# Patient Record
Sex: Female | Born: 1940 | Race: Black or African American | Hispanic: No | Smoking: Never smoker
Health system: Southern US, Community
[De-identification: ages and names within clinical notes are randomized; demographics above are authoritative.]

## PROBLEM LIST (undated history)

## (undated) DIAGNOSIS — I10 Essential (primary) hypertension: Secondary | ICD-10-CM

---

## 2000-01-21 ENCOUNTER — Other Ambulatory Visit: Admission: RE | Admit: 2000-01-21 | Discharge: 2000-01-21 | Payer: Self-pay | Admitting: Internal Medicine

## 2000-02-05 ENCOUNTER — Ambulatory Visit (HOSPITAL_COMMUNITY): Admission: RE | Admit: 2000-02-05 | Discharge: 2000-02-05 | Payer: Self-pay | Admitting: Internal Medicine

## 2007-12-31 ENCOUNTER — Emergency Department (HOSPITAL_COMMUNITY): Admission: EM | Admit: 2007-12-31 | Discharge: 2007-12-31 | Payer: Self-pay | Admitting: Emergency Medicine

## 2008-01-29 ENCOUNTER — Encounter (INDEPENDENT_AMBULATORY_CARE_PROVIDER_SITE_OTHER): Payer: Self-pay | Admitting: Nurse Practitioner

## 2008-01-29 ENCOUNTER — Ambulatory Visit: Payer: Self-pay | Admitting: Internal Medicine

## 2008-01-29 LAB — CONVERTED CEMR LAB
ALT: <8 units/L (ref 0–35)
AST: 10 units/L (ref 0–37)
Albumin: 4.4 g/dL (ref 3.5–5.2)
Alkaline Phosphatase: 91 units/L (ref 39–117)
BUN: 12 mg/dL (ref 6–23)
Basophils Absolute: 0 10*3/uL (ref 0.0–0.1)
Basophils Relative: 1 % (ref 0–1)
CO2: 23 meq/L (ref 19–32)
Calcium: 10 mg/dL (ref 8.4–10.5)
Chloride: 100 meq/L (ref 96–112)
Creatinine, Ser: 0.92 mg/dL (ref 0.40–1.20)
Eosinophils Absolute: 0.2 10*3/uL (ref 0.0–0.7)
Eosinophils Relative: 3 % (ref 0–5)
Glucose, Bld: 104 mg/dL — ABNORMAL HIGH (ref 70–99)
HCT: 43.7 % (ref 36.0–46.0)
Hemoglobin: 13.9 g/dL (ref 12.0–15.0)
Lymphocytes Relative: 44 % (ref 12–46)
Lymphs Abs: 3 10*3/uL (ref 0.7–4.0)
MCHC: 31.8 g/dL (ref 30.0–36.0)
MCV: 90.5 fL (ref 78.0–100.0)
Monocytes Absolute: 0.3 10*3/uL (ref 0.1–1.0)
Monocytes Relative: 4 % (ref 3–12)
Neutro Abs: 3.3 10*3/uL (ref 1.7–7.7)
Neutrophils Relative %: 48 % (ref 43–77)
Platelets: 274 10*3/uL (ref 150–400)
Potassium: 4.3 meq/L (ref 3.5–5.3)
RBC: 4.83 M/uL (ref 3.87–5.11)
RDW: 14.5 % (ref 11.5–15.5)
Sodium: 134 meq/L — ABNORMAL LOW (ref 135–145)
TSH: 1.596 microintl units/mL (ref 0.350–5.50)
Total Bilirubin: 0.4 mg/dL (ref 0.3–1.2)
Total Protein: 7.8 g/dL (ref 6.0–8.3)
WBC: 6.9 10*3/uL (ref 4.0–10.5)

## 2008-04-29 ENCOUNTER — Ambulatory Visit: Payer: Self-pay | Admitting: Internal Medicine

## 2008-04-29 ENCOUNTER — Encounter (INDEPENDENT_AMBULATORY_CARE_PROVIDER_SITE_OTHER): Payer: Self-pay | Admitting: Nurse Practitioner

## 2008-04-29 LAB — CONVERTED CEMR LAB
Cholesterol: 277 mg/dL — ABNORMAL HIGH (ref 0–200)
HDL: 58 mg/dL (ref >39)
LDL Cholesterol: 177 mg/dL — ABNORMAL HIGH (ref 0–99)
Total CHOL/HDL Ratio: 4.8
Triglycerides: 208 mg/dL — ABNORMAL HIGH (ref <150)
VLDL: 42 mg/dL — ABNORMAL HIGH (ref 0–40)

## 2008-05-06 ENCOUNTER — Ambulatory Visit (HOSPITAL_COMMUNITY): Admission: RE | Admit: 2008-05-06 | Discharge: 2008-05-06 | Payer: Self-pay | Admitting: Family Medicine

## 2009-01-28 IMAGING — CT CT HEAD W/O CM
1 series · 16 of 28 positions shown, 20 images · IV contrast (agent unspecified)
Comparison: None.

CLINICAL DATA: 66-year-old female, dizziness. Not feeling well. Facial droop on the right side.   Question Bell?s palsy. 
 HEAD CT WITHOUT CONTRAST:
TECHNIQUE: Contiguous axial images were obtained from the base of the skull through the vertex according to standard protocol without contrast.

[Series 2: brain · axial · 0.47mm/px · z∈[+119,+244]mm · 16 of 28 slices shown, 20 images]
[im 2/28  brain]
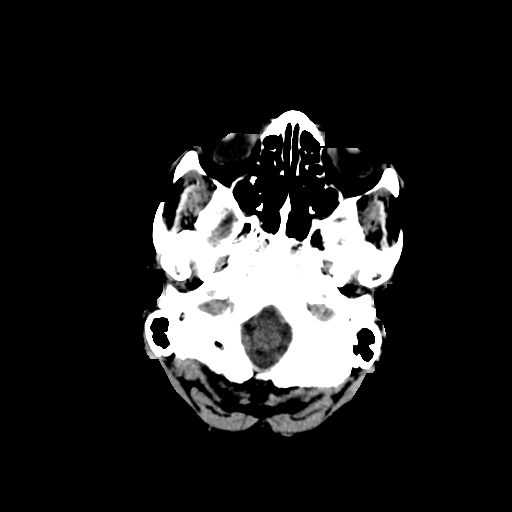
[im 2/28  bone]
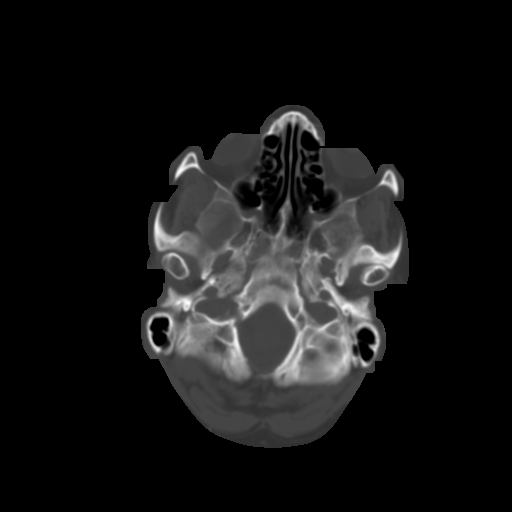
[im 4/28  brain]
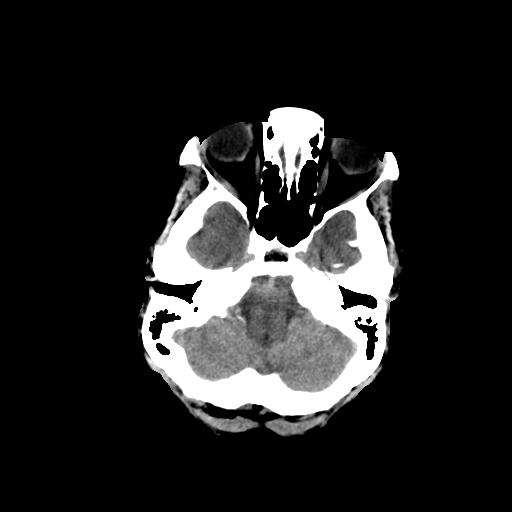
[im 6/28  brain]
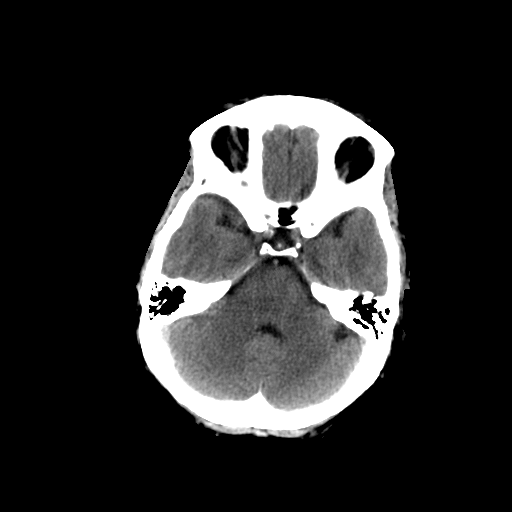
[im 7/28  brain]
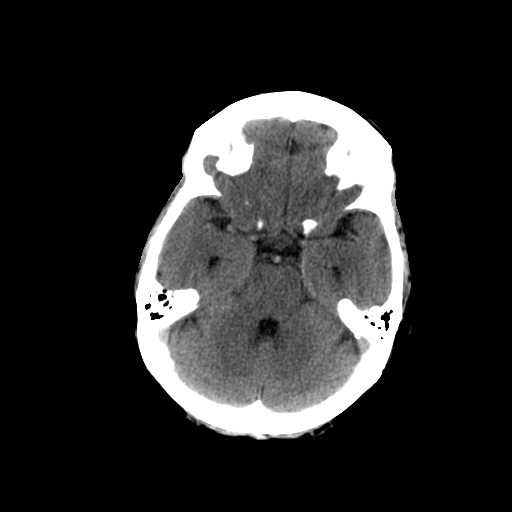
[im 9/28  brain]
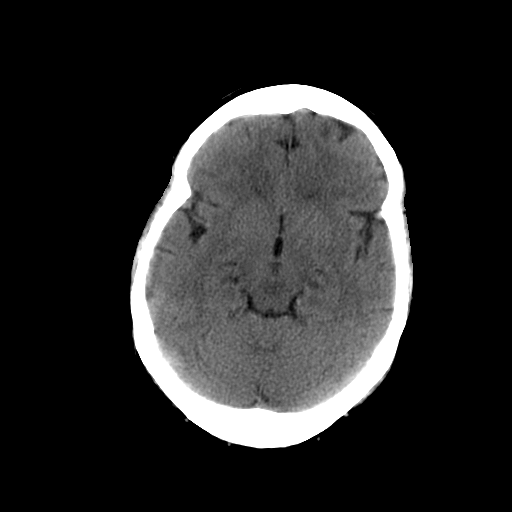
[im 9/28  bone]
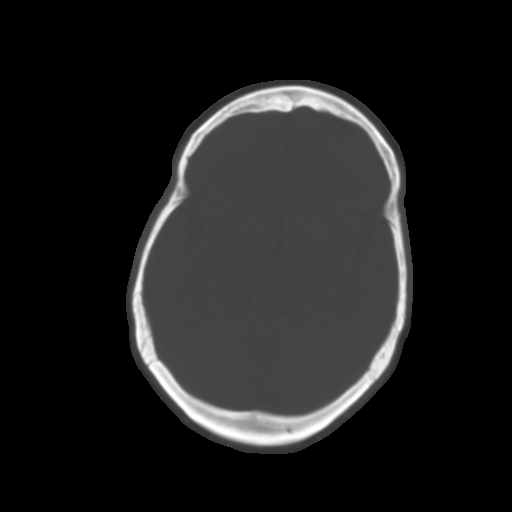
[im 10/28  brain]
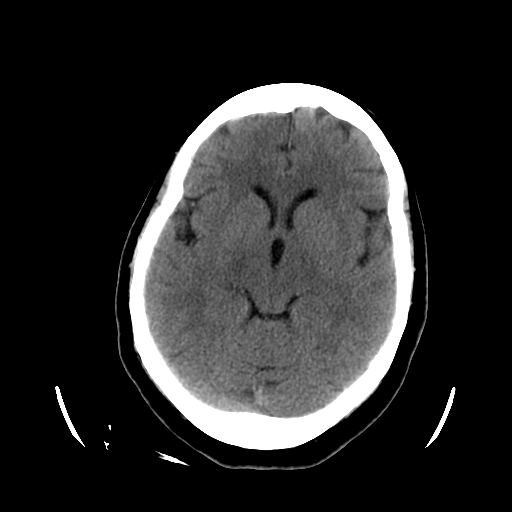
[im 12/28  brain]
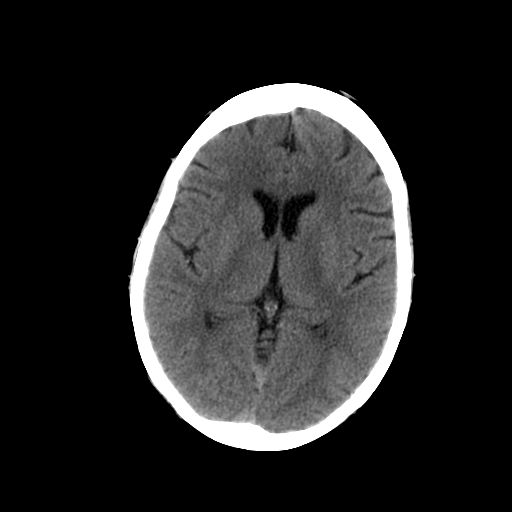
[im 14/28  brain]
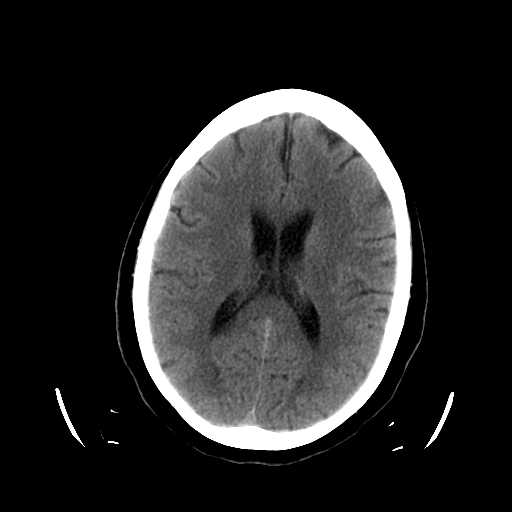
[im 15/28  brain]
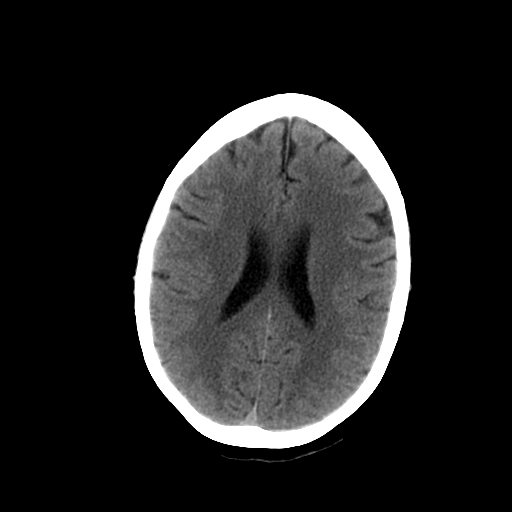
[im 15/28  bone]
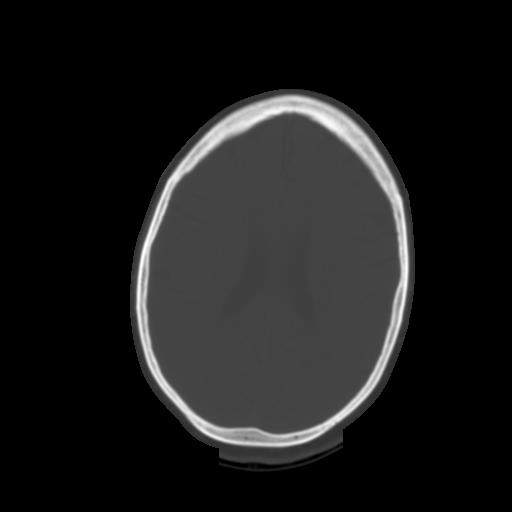
[im 17/28  brain]
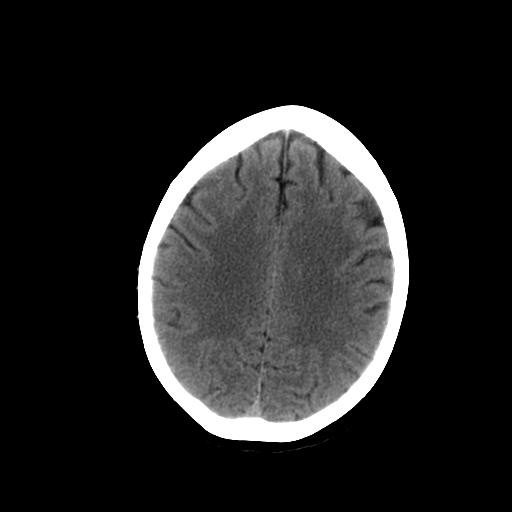
[im 19/28  brain]
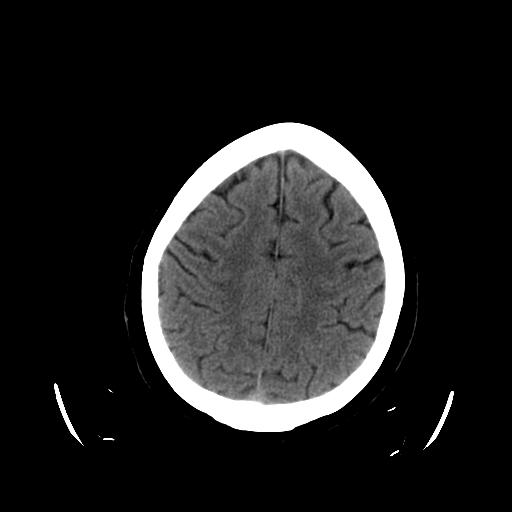
[im 20/28  brain]
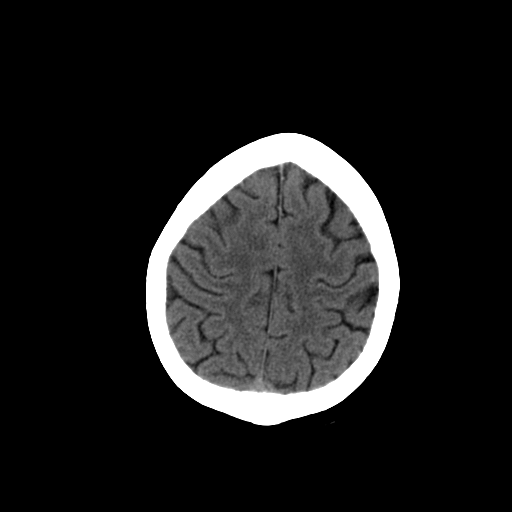
[im 22/28  brain]
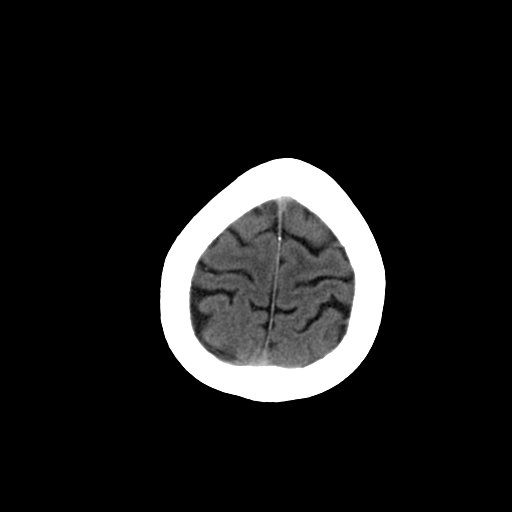
[im 22/28  bone]
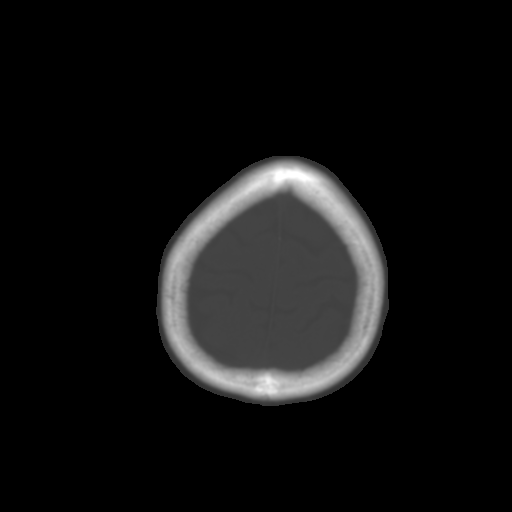
[im 23/28  brain]
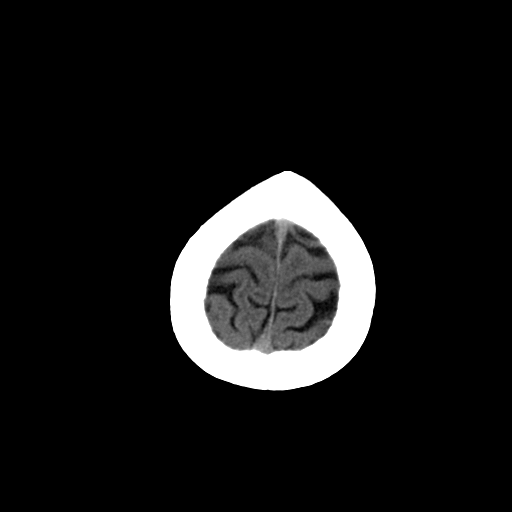
[im 25/28  brain]
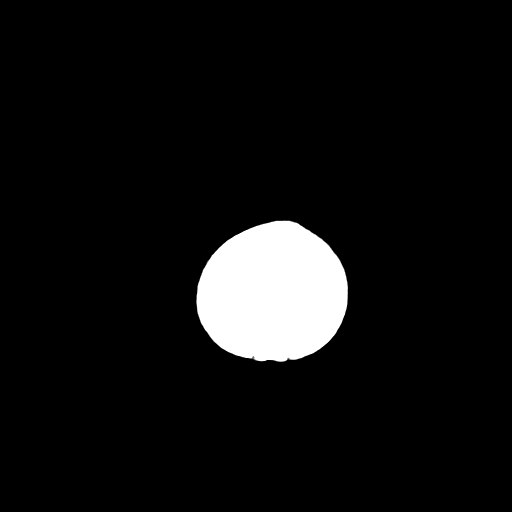
[im 27/28  brain]
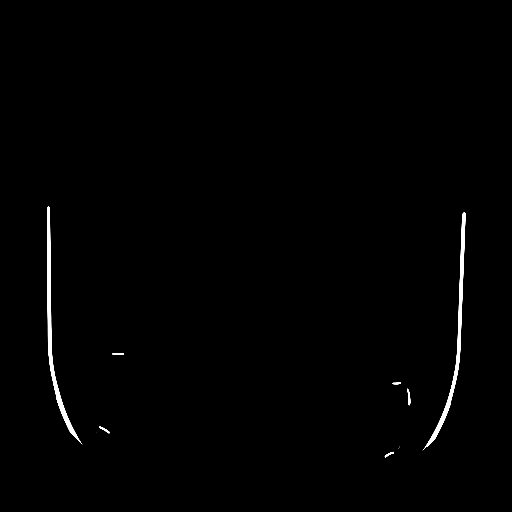

[16 of 28 positions shown; findings below may reference images not displayed]

FINDINGS: No acute intracranial abnormalities are present.  Specifically, there is no evidence for acute infarct, hemorrhage, mass, hydrocephalus, or extraaxial fluid collection.  The extracranial soft tissues are unremarkable.  The globes and orbits are intact.  The paranasal sinuses and mastoid air cells are clear.
IMPRESSION: No acute intracranial abnormality.

## 2011-08-30 LAB — URINALYSIS, ROUTINE W REFLEX MICROSCOPIC
Glucose, UA: NEGATIVE
Ketones, ur: 15 — AB
Nitrite: NEGATIVE
Protein, ur: 100 — AB
Specific Gravity, Urine: 1.036 — ABNORMAL HIGH
Urobilinogen, UA: 1
pH: 5.5

## 2011-08-30 LAB — URINE MICROSCOPIC-ADD ON

## 2014-07-20 ENCOUNTER — Emergency Department (INDEPENDENT_AMBULATORY_CARE_PROVIDER_SITE_OTHER)
Admission: EM | Admit: 2014-07-20 | Discharge: 2014-07-20 | Disposition: A | Discharge status: Home / Self Care | Payer: Medicare Other | Source: Home / Self Care

## 2014-07-20 ENCOUNTER — Encounter (HOSPITAL_COMMUNITY): Payer: Self-pay | Admitting: Emergency Medicine

## 2014-07-20 DIAGNOSIS — L237 Allergic contact dermatitis due to plants, except food: Secondary | ICD-10-CM

## 2014-07-20 DIAGNOSIS — L255 Unspecified contact dermatitis due to plants, except food: Secondary | ICD-10-CM

## 2014-07-20 MED ORDER — TRIAMCINOLONE ACETONIDE 0.1 % EX CREA: 1.0000 "application " | TOPICAL_CREAM | Freq: Two times a day (BID) | CUTANEOUS | Status: DC

## 2014-07-20 MED ORDER — PREDNISONE 20 MG PO TABS: ORAL_TABLET | ORAL | Status: DC

## 2014-07-20 NOTE — ED Notes (Signed)
Pt c/o rash on face, chest, arms onset Monday am when she woke up Reports she was doing yard work on Saturday  Denies fevers, cold sx, SOB, dyspnea Alert w/no signs of acute distress.

## 2014-07-20 NOTE — Discharge Instructions (Signed)
Contact Dermatitis Contact dermatitis is a rash that happens when something touches the skin. You touched something that irritates your skin, or you have allergies to something you touched. HOME CARE   Avoid the thing that caused your rash.  Keep your rash away from hot water, soap, sunlight, chemicals, and other things that might bother it.  Do not scratch your rash.  You can take cool baths to help stop itching.  Only take medicine as told by your doctor.  Keep all doctor visits as told. GET HELP RIGHT AWAY IF:   Your rash is not better after 3 days.  Your rash gets worse.  Your rash is puffy (swollen), tender, red, sore, or warm.  You have problems with your medicine. MAKE SURE YOU:   Understand these instructions.  Will watch your condition.  Will get help right away if you are not doing well or get worse. Document Released: 09/22/2009 Document Revised: 02/17/2012 Document Reviewed: 04/30/2011 [facility] Patient Information 2015 [place], [place]. This information is not intended to replace advice given to you by your health care provider. Make sure you discuss any questions you have with your health care provider.  Contact Dermatitis Contact dermatitis is a rash that happens when something touches the skin. You touched something that irritates your skin, or you have allergies to something you touched. HOME CARE   Avoid the thing that caused your rash.  Keep your rash away from hot water, soap, sunlight, chemicals, and other things that might bother it.  Do not scratch your rash.  You can take cool baths to help stop itching.  Only take medicine as told by your doctor.  Keep all doctor visits as told. GET HELP RIGHT AWAY IF:   Your rash is not better after 3 days.  Your rash gets worse.  Your rash is puffy (swollen), tender, red, sore, or warm.  You have problems with your medicine. MAKE SURE YOU:   Understand these instructions.  Will watch your  condition.  Will get help right away if you are not doing well or get worse. Document Released: 09/22/2009 Document Revised: 02/17/2012 Document Reviewed: 04/30/2011 [facility] Patient Information 2015 [place], [place]. This information is not intended to replace advice given to you by your health care provider. Make sure you discuss any questions you have with your health care provider.  Contact Dermatitis Contact dermatitis is a reaction to certain substances that touch the skin. Contact dermatitis can be either irritant contact dermatitis or allergic contact dermatitis. Irritant contact dermatitis does not require previous exposure to the substance for a reaction to occur.Allergic contact dermatitis only occurs if you have been exposed to the substance before. Upon a repeat exposure, your body reacts to the substance.  CAUSES  Many substances can cause contact dermatitis. Irritant dermatitis is most commonly caused by repeated exposure to mildly irritating substances, such as:  Makeup.  Soaps.  Detergents.  Bleaches.  Acids.  Metal salts, such as nickel. Allergic contact dermatitis is most commonly caused by exposure to:  Poisonous plants.  Chemicals (deodorants, shampoos).  Jewelry.  Latex.  Neomycin in triple antibiotic cream.  Preservatives in products, including clothing. SYMPTOMS  The area of skin that is exposed may develop:  Dryness or flaking.  Redness.  Cracks.  Itching.  Pain or a burning sensation.  Blisters. With allergic contact dermatitis, there may also be swelling in areas such as the eyelids, mouth, or genitals.  DIAGNOSIS  Your caregiver can usually tell what the problem is  by doing a physical exam. In cases where the cause is uncertain and an allergic contact dermatitis is suspected, a patch skin test may be performed to help determine the cause of your dermatitis. TREATMENT Treatment includes protecting the skin from further contact with the  irritating substance by avoiding that substance if possible. Barrier creams, powders, and gloves may be helpful. Your caregiver may also recommend:  Steroid creams or ointments applied 2 times daily. For best results, soak the rash area in cool water for 20 minutes. Then apply the medicine. Cover the area with a plastic wrap. You can store the steroid cream in the refrigerator for a "chilly" effect on your rash. That may decrease itching. Oral steroid medicines may be needed in more severe cases.  Antibiotics or antibacterial ointments if a skin infection is present.  Antihistamine lotion or an antihistamine taken by mouth to ease itching.  Lubricants to keep moisture in your skin.  Burow's solution to reduce redness and soreness or to dry a weeping rash. Mix one packet or tablet of solution in 2 cups cool water. Dip a clean washcloth in the mixture, wring it out a bit, and put it on the affected area. Leave the cloth in place for 30 minutes. Do this as often as possible throughout the day.  Taking several cornstarch or baking soda baths daily if the area is too large to cover with a washcloth. Harsh chemicals, such as alkalis or acids, can cause skin damage that is like a burn. You should flush your skin for 15 to 20 minutes with cold water after such an exposure. You should also seek immediate medical care after exposure. Bandages (dressings), antibiotics, and pain medicine may be needed for severely irritated skin.  HOME CARE INSTRUCTIONS  Avoid the substance that caused your reaction.  Keep the area of skin that is affected away from hot water, soap, sunlight, chemicals, acidic substances, or anything else that would irritate your skin.  Do not scratch the rash. Scratching may cause the rash to become infected.  You may take cool baths to help stop the itching.  Only take over-the-counter or prescription medicines as directed by your caregiver.  See your caregiver for follow-up care as  directed to make sure your skin is healing properly. SEEK MEDICAL CARE IF:   Your condition is not better after 3 days of treatment.  You seem to be getting worse.  You see signs of infection such as swelling, tenderness, redness, soreness, or warmth in the affected area.  You have any problems related to your medicines. Document Released: 11/22/2000 Document Revised: 02/17/2012 Document Reviewed: 04/30/2011 [facility] Patient Information 2015 [place], [place]. This information is not intended to replace advice given to you by your health care provider. Make sure you discuss any questions you have with your health care provider.  Poison [facility] is an inflammation of the skin (contact dermatitis). It is caused by contact with the allergens on the leaves of the oak (toxicodendron) plants. Depending on your sensitivity, the rash may consist simply of redness and itching, or it may also progress to blisters which may break open (rupture). These must be well cared for to prevent secondary germ (bacterial) infection as these infections can lead to scarring. The eyes may also get puffy. The puffiness is worst in the morning and gets better as the day progresses. Healing is best accomplished by keeping any open areas dry, clean, covered with a bandage, and covered with an antibacterial ointment if  needed. Without secondary infection, this dermatitis usually heals without scarring within 2 to 3 weeks without treatment. HOME CARE INSTRUCTIONS When you have been exposed to poison oak, it is very important to thoroughly wash with soap and water as soon as the exposure has been discovered. You have about one half hour to remove the plant resin before it will cause the rash. This cleaning will quickly destroy the oil or antigen on the skin (the antigen is what causes the rash). Wash aggressively under the fingernails as any plant resin still there will continue to spread the rash. Do not rub skin vigorously  when washing affected area. Poison oak cannot spread if no oil from the plant remains on your body. Rash that has progressed to weeping sores (lesions) will not spread the rash unless you have not washed thoroughly. It is also important to clean any clothes you have been wearing as they may carry active allergens which will spread the rash, even several days later. Avoidance of the plant in the future is the best measure. Poison oak plants can be recognized by the number of leaves. Generally, poison oak has three leaves with flowering branches on a single stem. Diphenhydramine may be purchased over the counter and used as needed for itching. Do not drive with this medication if it makes you drowsy. Ask your caregiver about medication for children. SEEK IMMEDIATE MEDICAL CARE IF:   Open areas of the rash develop.  You notice redness extending beyond the area of the rash.  There is a pus like discharge.  There is increased pain.  Other signs of infection develop (such as fever). Document Released: 06/01/2003 Document Revised: 02/17/2012 Document Reviewed: 10/11/2009 [facility] Patient Information 2015 [place], [place]. This information is not intended to replace advice given to you by your health care provider. Make sure you discuss any questions you have with your health care provider.  Poison [facility] is a rash caused by touching the leaves of the poison oak plant. You may have a rash with redness and itching. Sometimes, blisters appear and break open. Your eyes may get puffy (swollen). Poison oak often heals in 2 to 3 weeks without treatment.  HOME CARE  If you touch poison oak:  Wash your skin with soap and water right away. Wash under your fingernails. Do not rub the skin very hard.  Wash any clothes you were wearing.  Avoid poison oak in the future. Poison oak usually has 3 leaves on a stem.  Use medicines to help with itching as told by your doctor. Do not drive when you take  this medicine.  Keep open sores dry, clean, and covered with a bandage and medicated cream, if needed.  Ask your doctor about medicine for children. GET HELP RIGHT AWAY IF:  You have open sores.  Redness spreads beyond the area of the rash.  There is yellowish white fluid (pus) coming from the rash.  Pain gets worse.  You have a temperature by mouth above 102 F (38.9 C), not controlled by medicine. MAKE SURE YOU:  Understand these instructions.  Will watch your condition.  Will get help right away if you are not doing well or get worse. Document Released: 12/28/2010 Document Revised: 02/17/2012 Document Reviewed: 12/28/2010 [facility] Patient Information 2015 [place], [place]. This information is not intended to replace advice given to you by your health care provider. Make sure you discuss any questions you have with your health care provider.  Poison [employer] ivy is  a rash caused by touching the leaves of the poison ivy plant. The rash often shows up 48 hours later. You might just have bumps, redness, and itching. Sometimes, blisters appear and break open. Your eyes may get puffy (swollen). Poison ivy often heals in 2 to 3 weeks without treatment. HOME CARE  If you touch poison ivy:  Wash your skin with soap and water right away. Wash under your fingernails. Do not rub the skin very hard.  Wash any clothes you were wearing.  Avoid poison ivy in the future. Poison ivy has 3 leaves on a stem.  Use medicine to help with itching as told by your doctor. Do not drive when you take this medicine.  Keep open sores dry, clean, and covered with a bandage and medicated cream, if needed.  Ask your doctor about medicine for children. GET HELP RIGHT AWAY IF:  You have open sores.  Redness spreads beyond the area of the rash.  There is yellowish white fluid (pus) coming from the rash.  Pain gets worse.  You have a temperature by mouth above 102 F (38.9 C), not controlled by  medicine. MAKE SURE YOU:  Understand these instructions.  Will watch your condition.  Will get help right away if you are not doing well or get worse. Document Released: 12/28/2010 Document Revised: 02/17/2012 Document Reviewed: 12/28/2010 [facility] Patient Information 2015 [place], [place]. This information is not intended to replace advice given to you by your health care provider. Make sure you discuss any questions you have with your health care provider.  Poison [employer] ivy is a inflammation of the skin (contact dermatitis) caused by touching the allergens on the leaves of the ivy plant following previous exposure to the plant. The rash usually appears 48 hours after exposure. The rash is usually bumps (papules) or blisters (vesicles) in a linear pattern. Depending on your own sensitivity, the rash may simply cause redness and itching, or it may also progress to blisters which may break open. These must be well cared for to prevent secondary bacterial (germ) infection, followed by scarring. Keep any open areas dry, clean, dressed, and covered with an antibacterial ointment if needed. The eyes may also get puffy. The puffiness is worst in the morning and gets better as the day progresses. This dermatitis usually heals without scarring, within 2 to 3 weeks without treatment. HOME CARE INSTRUCTIONS  Thoroughly wash with soap and water as soon as you have been exposed to poison ivy. You have about one half hour to remove the plant resin before it will cause the rash. This washing will destroy the oil or antigen on the skin that is causing, or will cause, the rash. Be sure to wash under your fingernails as any plant resin there will continue to spread the rash. Do not rub skin vigorously when washing affected area. Poison ivy cannot spread if no oil from the plant remains on your body. A rash that has progressed to weeping sores will not spread the rash unless you have not washed thoroughly. It is  also important to wash any clothes you have been wearing as these may carry active allergens. The rash will return if you wear the unwashed clothing, even several days later. Avoidance of the plant in the future is the best measure. Poison ivy plant can be recognized by the number of leaves. Generally, poison ivy has three leaves with flowering branches on a single stem. Diphenhydramine may be purchased over the counter and used  as needed for itching. Do not drive with this medication if it makes you drowsy.Ask your caregiver about medication for children. SEEK MEDICAL CARE IF:  Open sores develop.  Redness spreads beyond area of rash.  You notice purulent (pus-like) discharge.  You have increased pain.  Other signs of infection develop (such as fever). Document Released: 11/22/2000 Document Revised: 02/17/2012 Document Reviewed: 05/05/2009 [facility] Patient Information 2015 [place], [place]. This information is not intended to replace advice given to you by your health care provider. Make sure you discuss any questions you have with your health care provider.  Poison [employer] ivy is a inflammation of the skin (contact dermatitis) caused by touching the allergens on the leaves of the ivy plant following previous exposure to the plant. The rash usually appears 48 hours after exposure. The rash is usually bumps (papules) or blisters (vesicles) in a linear pattern. Depending on your own sensitivity, the rash may simply cause redness and itching, or it may also progress to blisters which may break open. These must be well cared for to prevent secondary bacterial (germ) infection, followed by scarring. Keep any open areas dry, clean, dressed, and covered with an antibacterial ointment if needed. The eyes may also get puffy. The puffiness is worst in the morning and gets better as the day progresses. This dermatitis usually heals without scarring, within 2 to 3 weeks without treatment. HOME CARE  INSTRUCTIONS  Thoroughly wash with soap and water as soon as you have been exposed to poison ivy. You have about one half hour to remove the plant resin before it will cause the rash. This washing will destroy the oil or antigen on the skin that is causing, or will cause, the rash. Be sure to wash under your fingernails as any plant resin there will continue to spread the rash. Do not rub skin vigorously when washing affected area. Poison ivy cannot spread if no oil from the plant remains on your body. A rash that has progressed to weeping sores will not spread the rash unless you have not washed thoroughly. It is also important to wash any clothes you have been wearing as these may carry active allergens. The rash will return if you wear the unwashed clothing, even several days later. Avoidance of the plant in the future is the best measure. Poison ivy plant can be recognized by the number of leaves. Generally, poison ivy has three leaves with flowering branches on a single stem. Diphenhydramine may be purchased over the counter and used as needed for itching. Do not drive with this medication if it makes you drowsy.Ask your caregiver about medication for children. SEEK MEDICAL CARE IF:  Open sores develop.  Redness spreads beyond area of rash.  You notice purulent (pus-like) discharge.  You have increased pain.  Other signs of infection develop (such as fever). Document Released: 11/22/2000 Document Revised: 02/17/2012 Document Reviewed: 05/05/2009 [facility] Patient Information 2015 [place], [place]. This information is not intended to replace advice given to you by your health care provider. Make sure you discuss any questions you have with your health care provider.

## 2014-07-20 NOTE — ED Provider Notes (Signed)
Medical screening examination/treatment/procedure(s) were performed by a resident physician or non-physician practitioner and as the supervising physician I was immediately available for consultation/collaboration.  Vauda Salvucci, MD    Chandlar Guice S Aerie Donica, MD 07/20/14 1406 

## 2014-07-20 NOTE — ED Provider Notes (Signed)
CSN: [id number]     Arrival date & time 07/20/14  [id number] History   First MD Initiated Contact with Patient 07/20/14 [phone]     Chief Complaint  Patient presents with  . Rash   (Consider location/radiation/quality/duration/timing/severity/associated sxs/prior Treatment) HPI Comments: This 73 year old female presents with rash and swelling of the face. She had been working outside in the yard trimming weeds or 4 days ago. The following day she developed a papular, pruritic rash to the arms. The following day she developed periorbital swelling as well is milder swelling to the left and right cheeks. There is minor erythema. She complains of minor intermittent itching to the face. The eyes are unaffected. She denies problems with vision, no drainage, burning or pain to the eyes.   History reviewed. No pertinent past medical history. History reviewed. No pertinent past surgical history. No family history on file. History  Substance Use Topics  . Smoking status: Never Smoker   . Smokeless tobacco: Not on file  . Alcohol Use: No   OB History   Grav Para Term Preterm Abortions TAB SAB Ect Mult Living                 Review of Systems  Constitutional: Negative.   HENT: Positive for facial swelling. Negative for congestion, hearing loss, nosebleeds, rhinorrhea, sinus pressure, sore throat and trouble swallowing.   Eyes: Negative for pain, discharge, itching and visual disturbance.  Respiratory: Negative.   Cardiovascular: Negative for chest pain and leg swelling.  Gastrointestinal: Negative.   Neurological: Negative.     Allergies  Review of patient's allergies indicates no known allergies.  Home Medications   Prior to Admission medications   Medication Sig Start Date End Date Taking? Authorizing Provider  predniSONE (DELTASONE) 20 MG tablet Take 3 tabs po on first day, 2 tabs second day, 2 tabs third day, 1 tab fourth day, 1 tab 5th day. Take with food. 07/20/14   Hayden Rasmussenavid Emmalise Huard, NP   triamcinolone cream (KENALOG) 0.1 % Apply 1 application topically 2 (two) times daily. 07/20/14   Hayden Rasmussenavid Demari Gales, NP   BP 194/90  Pulse 74  Temp(Src) 98.4 F (36.9 C) (Oral)  Resp 18  SpO2 99% Physical Exam  Nursing note and vitals reviewed. Constitutional: She is oriented to person, place, and time. She appears well-developed and well-nourished. No distress.  HENT:  Head: Normocephalic and atraumatic.  Right Ear: External ear normal.  Left Ear: External ear normal.  Mouth/Throat: Oropharynx is clear and moist. No oropharyngeal exudate.   No intraoral swelling or erythema.  Eyes: Conjunctivae and EOM are normal. Pupils are equal, round, and reactive to light. Right eye exhibits no discharge. Left eye exhibits no discharge. No scleral icterus.  Neck: Normal range of motion. Neck supple.  Cardiovascular: Normal rate, regular rhythm and normal heart sounds.   Pulmonary/Chest: Effort normal and breath sounds normal. No respiratory distress. She has no wheezes. She has no rales.  Lymphadenopathy:    She has no cervical adenopathy.  Neurological: She is alert and oriented to person, place, and time.  Skin: Skin is warm and dry.  As per history of present illness. Small papules to a small area of the left forearm and right forearm. Swelling about the face, right and left orbits. No significant erythema and no cellulitis.    ED Course  Procedures (including critical care time) Labs Review Labs Reviewed - No data to display  Imaging Review No results found.   MDM   1. Allergic  contact dermatitis due to plants, except food     Triamcinolone to affected areas Prednisone taper      Hayden Rasmussen, NP 07/20/14 [phone]

## 2016-05-21 ENCOUNTER — Ambulatory Visit (HOSPITAL_COMMUNITY)
Admission: EM | Admit: 2016-05-21 | Discharge: 2016-05-21 | Disposition: A | Discharge status: Home / Self Care | Payer: Medicare Other | Attending: Family Medicine | Admitting: Family Medicine

## 2016-05-21 ENCOUNTER — Encounter (HOSPITAL_COMMUNITY): Payer: Self-pay | Admitting: Emergency Medicine

## 2016-05-21 DIAGNOSIS — M5441 Lumbago with sciatica, right side: Secondary | ICD-10-CM | POA: Diagnosis not present

## 2016-05-21 HISTORY — DX: Essential (primary) hypertension: I10

## 2016-05-21 MED ORDER — CYCLOBENZAPRINE HCL 10 MG PO TABS: 10.0000 mg | ORAL_TABLET | Freq: Two times a day (BID) | ORAL | Status: DC | PRN

## 2016-05-21 MED ORDER — TRAMADOL HCL 50 MG PO TABS
50.0000 mg | ORAL_TABLET | Freq: Four times a day (QID) | ORAL | Status: AC | PRN
Start: 2016-05-21 — End: ?

## 2016-05-21 NOTE — ED Provider Notes (Signed)
CSN: [id number]     Arrival date & time 05/21/16  1302 History   First MD Initiated Contact with Patient 05/21/16 1354     Chief Complaint  Patient presents with  . Flank Pain   (Consider location/radiation/quality/duration/timing/severity/associated sxs/prior Treatment) HPI History obtained from patient: Location: Right lumbar area  Context/Duration: picking up dog food Saturday, onset of low back pain since that time   Severity: 2  Quality:ache (tothache) Timing: episodic           Home Treatment: tylenol and heat Associated symptoms:  Pain radiating into right thigh Family History:HTN-father    Past Medical History  Diagnosis Date  . Hypertension    History reviewed. No pertinent past surgical history. Family History  Problem Relation Age of Onset  . Hypertension Father    Social History  Substance Use Topics  . Smoking status: Never Smoker   . Smokeless tobacco: None  . Alcohol Use: No   OB History    No data available     Review of Systems  Denies: HEADACHE, NAUSEA, ABDOMINAL PAIN, CHEST PAIN, CONGESTION, DYSURIA, SHORTNESS OF BREATH  Allergies  Review of patient's allergies indicates no known allergies.  Home Medications   Prior to Admission medications   Medication Sig Start Date End Date Taking? Authorizing Provider  OVER THE COUNTER MEDICATION Takes two different blood pressure medicines   Yes Historical Provider, MD  cyclobenzaprine (FLEXERIL) 10 MG tablet Take 1 tablet (10 mg total) by mouth 2 (two) times daily as needed for muscle spasms. 05/21/16   Tharon AquasFrank C Teria Khachatryan, PA  predniSONE (DELTASONE) 20 MG tablet Take 3 tabs po on first day, 2 tabs second day, 2 tabs third day, 1 tab fourth day, 1 tab 5th day. Take with food. 07/20/14   Hayden Rasmussenavid Mabe, NP  traMADol (ULTRAM) 50 MG tablet Take 1 tablet (50 mg total) by mouth every 6 (six) hours as needed. 05/21/16   Tharon AquasFrank C Declin Rajan, PA  triamcinolone cream (KENALOG) 0.1 % Apply 1 application topically 2 (two) times  daily. 07/20/14   Hayden Rasmussenavid Mabe, NP   Meds Ordered and Administered this Visit  Medications - No data to display  BP 151/59 mmHg  Pulse 81  Temp(Src) 98.4 F (36.9 C)  Resp 20  SpO2 99% No data found.   Physical Exam NURSES NOTES AND VITAL SIGNS REVIEWED. CONSTITUTIONAL: Well developed, well nourished, no acute distress HEENT: normocephalic, atraumatic EYES: Conjunctiva normal NECK:normal ROM, supple, no adenopathy PULMONARY:No respiratory distress, normal effort ABDOMINAL: Soft, ND, NT BS+, No CVAT MUSCULOSKELETAL: Normal ROM of all extremities, tenderness right lumbar, with tenderness proximal sciatic insertion.  SKIN: warm and dry without rash PSYCHIATRIC: Mood and affect, behavior are normal  ED Course  Procedures (including critical care time)  Labs Review Labs Reviewed - No data to display  Imaging Review No results found.   Visual Acuity Review  Right Eye Distance:   Left Eye Distance:   Bilateral Distance:    Right Eye Near:   Left Eye Near:    Bilateral Near:      RX flexeril and tramadol Expect full recovery No neurological symptoms    MDM   1. Right-sided low back pain with right-sided sciatica     Patient is reassured that there are no issues that require transfer to higher level of care at this time or additional tests. Patient is advised to continue home symptomatic treatment. Patient is advised that if there are new or worsening symptoms to attend the emergency department,  contact primary care provider, or return to UC. Instructions of care provided discharged home in stable condition.    THIS NOTE WAS GENERATED USING A VOICE RECOGNITION SOFTWARE PROGRAM. ALL REASONABLE EFFORTS  WERE MADE TO PROOFREAD THIS DOCUMENT FOR ACCURACY.  I have verbally reviewed the discharge instructions with the patient. A printed AVS was given to the patient.  All questions were answered prior to discharge.      Tharon Aquas, PA 05/21/16 1428

## 2016-05-21 NOTE — Discharge Instructions (Signed)
Back Pain, Adult Back pain is very common. The pain often gets better over time. The cause of back pain is usually not dangerous. Most people can learn to manage their back pain on their own.  HOME CARE  Watch your back pain for any changes. The following actions may help to lessen any pain you are feeling:  Stay active. Start with short walks on flat ground if you can. Try to walk farther each day.  Exercise regularly as told by your doctor. Exercise helps your back heal faster. It also helps avoid future injury by keeping your muscles strong and flexible.  Do not sit, drive, or stand in one place for more than 30 minutes.  Do not stay in bed. Resting more than 1-2 days can slow down your recovery.  Be careful when you bend or lift an object. Use good form when lifting:  Bend at your knees.  Keep the object close to your body.  Do not twist.  Sleep on a firm mattress. Lie on your side, and bend your knees. If you lie on your back, put a pillow under your knees.  Take medicines only as told by your doctor.  Put ice on the injured area.  Put ice in a plastic bag.  Place a towel between your skin and the bag.  Leave the ice on for 20 minutes, 2-3 times a day for the first 2-3 days. After that, you can switch between ice and heat packs.  Avoid feeling anxious or stressed. Find good ways to deal with stress, such as exercise.  Maintain a healthy weight. Extra weight puts stress on your back. GET HELP IF:   You have pain that does not go away with rest or medicine.  You have worsening pain that goes down into your legs or buttocks.  You have pain that does not get better in one week.  You have pain at night.  You lose weight.  You have a fever or chills. GET HELP RIGHT AWAY IF:   You cannot control when you poop (bowel movement) or pee (urinate).  Your arms or legs feel weak.  Your arms or legs lose feeling (numbness).  You feel sick to your stomach (nauseous) or  throw up (vomit).  You have belly (abdominal) pain.  You feel like you may pass out (faint).   This information is not intended to replace advice given to you by your health care provider. Make sure you discuss any questions you have with your health care provider.   Document Released: 05/13/2008 Document Revised: 12/16/2014 Document Reviewed: 03/29/2014 Elsevier Interactive Patient Education 2016 Elsevier Inc. Sciatica Sciatica is pain, weakness, numbness, or tingling along your sciatic nerve. The nerve starts in the lower back and runs down the back of each leg. Nerve damage or certain conditions pinch or put pressure on the sciatic nerve. This causes the pain, weakness, and other discomforts of sciatica. HOME CARE   Only take medicine as told by your doctor.  Apply ice to the affected area for 20 minutes. Do this 3-4 times a day for the first 48-72 hours. Then try heat in the same way.  Exercise, stretch, or do your usual activities if these do not make your pain worse.  Go to physical therapy as told by your doctor.  Keep all doctor visits as told.  Do not wear high heels or shoes that are not supportive.  Get a firm mattress if your mattress is too soft to lessen pain  and discomfort. GET HELP RIGHT AWAY IF:   You cannot control when you poop (bowel movement) or pee (urinate).  You have more weakness in your lower back, lower belly (pelvis), butt (buttocks), or legs.  You have redness or puffiness (swelling) of your back.  You have a burning feeling when you pee.  You have pain that gets worse when you lie down.  You have pain that wakes you from your sleep.  Your pain is worse than past pain.  Your pain lasts longer than 4 weeks.  You are suddenly losing weight without reason. MAKE SURE YOU:   Understand these instructions.  Will watch this condition.  Will get help right away if you are not doing well or get worse.   This information is not intended to  replace advice given to you by your health care provider. Make sure you discuss any questions you have with your health care provider.   Document Released: 09/03/2008 Document Revised: 08/16/2015 Document Reviewed: 04/05/2012 Elsevier Interactive Patient Education [employer] Else[employer] Inc[employer]

## 2016-05-21 NOTE — ED Notes (Signed)
Right flank pain that radiates to right thigh.  No known injury.  Onset Saturday, but minor.  Sunday morning is when extreme pain started.  Patient reports walking -there is no pain.  Sitting or lying down is painful-intensity of pain grows

## 2016-05-27 ENCOUNTER — Ambulatory Visit (INDEPENDENT_AMBULATORY_CARE_PROVIDER_SITE_OTHER): Payer: Medicare Other

## 2016-05-27 ENCOUNTER — Encounter (HOSPITAL_COMMUNITY): Payer: Self-pay | Admitting: *Deleted

## 2016-05-27 ENCOUNTER — Ambulatory Visit (HOSPITAL_COMMUNITY)
Admission: EM | Admit: 2016-05-27 | Discharge: 2016-05-27 | Disposition: A | Discharge status: Home / Self Care | Payer: Medicare Other | Attending: Emergency Medicine | Admitting: Emergency Medicine

## 2016-05-27 DIAGNOSIS — M5441 Lumbago with sciatica, right side: Secondary | ICD-10-CM | POA: Diagnosis not present

## 2016-05-27 DIAGNOSIS — R319 Hematuria, unspecified: Secondary | ICD-10-CM | POA: Diagnosis not present

## 2016-05-27 DIAGNOSIS — Z8249 Family history of ischemic heart disease and other diseases of the circulatory system: Secondary | ICD-10-CM | POA: Insufficient documentation

## 2016-05-27 DIAGNOSIS — I10 Essential (primary) hypertension: Secondary | ICD-10-CM | POA: Diagnosis not present

## 2016-05-27 LAB — POCT URINALYSIS DIP (DEVICE)
BILIRUBIN URINE: NEGATIVE
GLUCOSE, UA: NEGATIVE mg/dL
KETONES UR: NEGATIVE mg/dL
Nitrite: NEGATIVE
Protein, ur: NEGATIVE mg/dL
SPECIFIC GRAVITY, URINE: 1.015 (ref 1.005–1.030)
Urobilinogen, UA: 1 mg/dL (ref 0.0–1.0)
pH: 7.5 (ref 5.0–8.0)

## 2016-05-27 MED ORDER — CEPHALEXIN 500 MG PO CAPS: 500.0000 mg | ORAL_CAPSULE | Freq: Two times a day (BID) | ORAL | Status: AC

## 2016-05-27 MED ORDER — TIZANIDINE HCL 4 MG PO TABS: 4.0000 mg | ORAL_TABLET | Freq: Three times a day (TID) | ORAL | Status: AC | PRN

## 2016-05-27 MED ORDER — PREDNISONE 10 MG (21) PO TBPK: ORAL_TABLET | ORAL | Status: AC

## 2016-05-27 NOTE — Discharge Instructions (Signed)
Stop the Flexeril. Try the Zanaflex instead. Do not take it unless you absolutely need to because it can make you sleepy and increase your risk for falls. Continue Tylenol 1 g up to 4 times a day as needed for pain. Continue tramadol for severe pain. Give us a working phone number so that we can contact you if we need to change your antibiotics. I am starting you on an antibiotic because it looks like he may have a urinary tract infection. Call here in several days, and if your urine is negative for urinary tract infection, stop the Keflex. Follow-up with your primary care physician in several days. Go to the ER for the signs and symptoms we discussed

## 2016-05-27 NOTE — ED Provider Notes (Signed)
HPI  SUBJECTIVE:  Sherri Benton is a 75 y.o. female who presents with right lower back pain that radiates down her lateral thigh for the past 10 days. She states the pain is throbbing, intermittent, present only with moving. It lasts seconds to minutes. Symptoms are better with Tylenol, worse with bending forward. She has tried Tylenol, tramadol, Flexeril. She was seen here 5 days prior to today's presentation, thought to have right low back pain with sciatica and sent home with tramadol and Flexeril. she states that these symptoms started after doing some heavy lifting. denies N/V, fevers, flank pain, abdominal pain, urinary urgency, frequency, dysuria, cloudy or odorous urine, hematuria.  No syncope. No saddle anesthesia, distal weakness/numbness, bilateral radicular leg pain/weakness, fevers, recent h/o trauma, neurological deficits,  bladder/ bowel incontinence, urinary retention, h/o CA / multiple myleoma, pain worse at night,  h/o prolonged steroid use, h/o osteopenia, h/o IVDU, h/o HIV.   no h/o pyelonephritis, nephrolithiasis. She has a past medical history of hypertension. Family history negative for nephrolithiasis. PMD: [facility]    Past Medical History  Diagnosis Date  . Hypertension     History reviewed. No pertinent past surgical history.  Family History  Problem Relation Age of Onset  . Hypertension Father     Social History  Substance Use Topics  . Smoking status: Never Smoker   . Smokeless tobacco: None  . Alcohol Use: No    No current facility-administered medications for this encounter.  Current outpatient prescriptions:  .  cephALEXin (KEFLEX) 500 MG capsule, Take 1 capsule (500 mg total) by mouth 2 (two) times daily., Disp: 14 capsule, Rfl: 0 .  OVER THE COUNTER MEDICATION, Takes two different blood pressure medicines, Disp: , Rfl:  .  predniSONE (STERAPRED UNI-PAK 21 TAB) 10 MG (21) TBPK tablet, Dispense one 6 day pack. Take as directed with food., Disp: 21  tablet, Rfl: 0 .  tiZANidine (ZANAFLEX) 4 MG tablet, Take 1 tablet (4 mg total) by mouth every 8 (eight) hours as needed for muscle spasms., Disp: 30 tablet, Rfl: 0 .  traMADol (ULTRAM) 50 MG tablet, Take 1 tablet (50 mg total) by mouth every 6 (six) hours as needed., Disp: 15 tablet, Rfl: 0  No Known Allergies   ROS  As noted in HPI.   Physical Exam  There were no vitals taken for this visit.  Constitutional: Well developed, well nourished, no acute distress Eyes:  EOMI, conjunctiva normal bilaterally HENT: Normocephalic, atraumatic,mucus membranes moist Respiratory: Normal inspiratory effort Cardiovascular: Normal rate GI: Soft, nontender, normal bowel sounds nondistended. No suprapubic or flank tenderness skin: No rash, skin intact Musculoskeletal: questionable R CVAT. + R paralumbar tenderness, + mild muscle spasm. No bony tenderness. Bilateral lower extremities nontender , baseline ROM with intact PT pulses,  No pain with int/ext rotation flex/extension hips bilaterally. SLR neg bilaterally. Pain aggravated with bending forward. Sensation baseline light touch bilaterally for Pt, DTR's symmetric and intact bilaterally KJ , Motor symmetric bilateral 5/5 hip flexion, quadriceps, hamstrings, EHL, foot dorsiflexion, foot plantarflexion, gait normal Neurologic: Alert & oriented x 3, no focal neuro deficits Psychiatric: Speech and behavior appropriate   ED Course   Medications - No data to display  Orders Placed This Encounter  Procedures  . Urine culture    Standing Status: Standing     Number of Occurrences: 1     Standing Expiration Date:     Order Specific Question:  List patient's active antibiotics    Answer:  keflex  Order Specific Question:  Patient immune status    Answer:  Normal  . DG Lumbar Spine Complete    Standing Status: Standing     Number of Occurrences: 1     Standing Expiration Date:     Order Specific Question:  Reason for Exam (SYMPTOM  OR DIAGNOSIS  REQUIRED)    Answer:  R LBP  . POCT urinalysis dip (device)    Standing Status: Standing     Number of Occurrences: 1     Standing Expiration Date:     Results for orders placed or performed during the hospital encounter of 05/27/16 (from the past 24 hour(s))  POCT urinalysis dip (device)     Status: Abnormal   Collection Time: 05/27/16  3:22 PM  Result Value Ref Range   Glucose, UA NEGATIVE NEGATIVE mg/dL   Bilirubin Urine NEGATIVE NEGATIVE   Ketones, ur NEGATIVE NEGATIVE mg/dL   Specific Gravity, Urine 1.015 1.005 - 1.030   Hgb urine dipstick SMALL (A) NEGATIVE   pH 7.5 5.0 - 8.0   Protein, ur NEGATIVE NEGATIVE mg/dL   Urobilinogen, UA 1.0 0.0 - 1.0 mg/dL   Nitrite NEGATIVE NEGATIVE   Leukocytes, UA SMALL (A) NEGATIVE   Dg Lumbar Spine Complete  05/27/2016  CLINICAL DATA:  Right low back pain since Saturday. Injury lifting heavy object EXAM: LUMBAR SPINE - COMPLETE 4+ VIEW COMPARISON:  None. FINDINGS: Mild degenerative spurring throughout the lumbar spine. Degenerative facet disease in the lower lumbar spine. Normal alignment. No fracture. SI joints are symmetric and unremarkable. IMPRESSION: Mild spondylosis.  No acute findings. Electronically Signed   By: Charlett Nose M.D.   On: 05/27/2016 15:52    ED Clinical Impression  Right-sided low back pain with right-sided sciatica   ED Assessment/Plan  Patient with small hematuria, leukocytes suggestive of UTI, however, she has no urinary symptoms. In the differential of the nephrolithiasis, will send urine off for culture and treat as if this is a UTI for now. We'll discontinue antibiotics if culture comes back negative.  Imaging independently reviewed. + degenerative changes, no fracture. See radiology report for details.  Presentation most consistent with lumbar strain. Patient was sent home with Flexeril and tramadol last time, pt states the Flexeril makes her sleepy and does not help. Will try Robaxin, have her continue  tramadol, put on regular Tylenol 1 g 4 times a day. She will need to follow-up with her primary care physician at the [facility] in several days.   Discussed labs, Imaging, medical decision-making, and plan for follow-up with the patient.  Discussed signs and symptoms that should prompt return to the emergency department.  Patient agrees with plan.  *This clinic note was created using Dragon dictation software. Therefore, there may be occasional mistakes despite careful proofreading.  ?    Domenick Gong, MD 05/28/16 1319

## 2016-05-27 NOTE — ED Notes (Signed)
Pt  Reports  r  Hip pain  With  Pain  Radiating  Down  r  Leg     - pt denys  Any  Injury        She  Ambulated  To  Room  With a  Steady  Fluid  Gait      She  States  She  Was  Seen  6  Days  Ago  For  Similar  Symptoms  Not getting  Any  Better

## 2016-05-29 LAB — URINE CULTURE: SPECIAL REQUESTS: NORMAL

## 2016-06-03 ENCOUNTER — Telehealth (HOSPITAL_COMMUNITY): Payer: Self-pay | Admitting: Emergency Medicine

## 2016-06-03 NOTE — ED Notes (Signed)
Called pt and notified of recent lab results from visit 6/19 Pt ID'd properly... Reports feeling better and sx have subsided Adv pt if sx are not getting better to return  Pt verb understanding  Per Dr. Piedad ClimesHonig,   Notes Recorded by Charm RingsErin J Honig, MD on 05/29/2016 at 6:18 PM Urine culture does not suggest infection. Given lack of urinary symptoms, it is recommended that she [facility] Continue zanaflex and prednisone as prescribed for back pain. Follow up as needed.

## 2017-06-25 IMAGING — DX DG LUMBAR SPINE COMPLETE 4+V
5 series · 5 of 5 positions shown · non-contrast
Comparison: None.

CLINICAL DATA: Right low back pain since [REDACTED]. Injury lifting
heavy object

EXAM:
LUMBAR SPINE - COMPLETE 4+ VIEW

[l-spine ap]
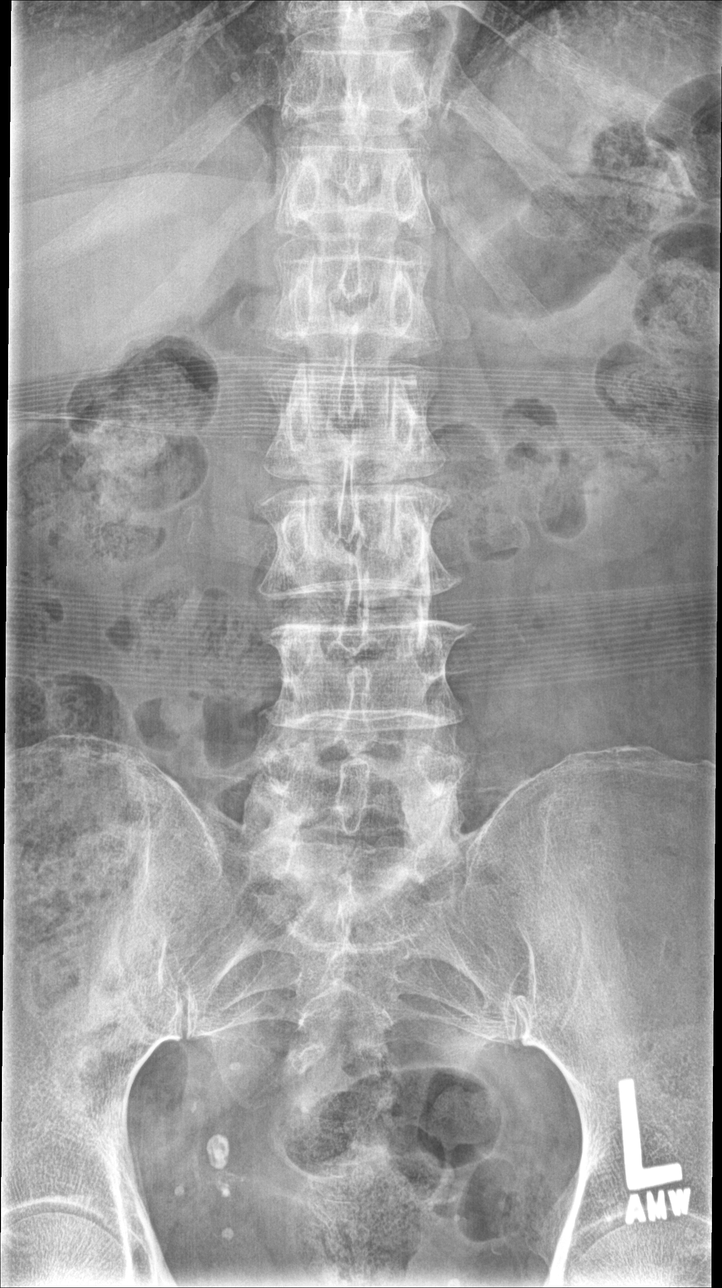

[l-spine obl (1 of 2)]
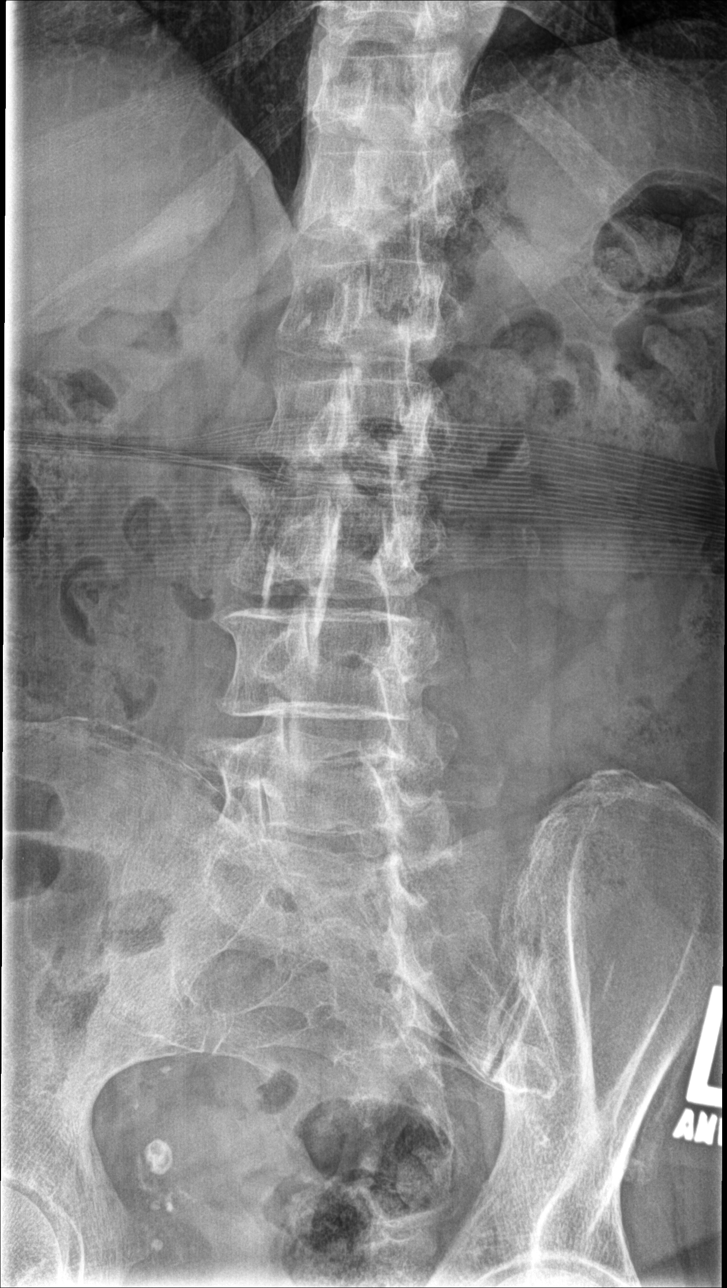

[l-spine obl (2 of 2)]
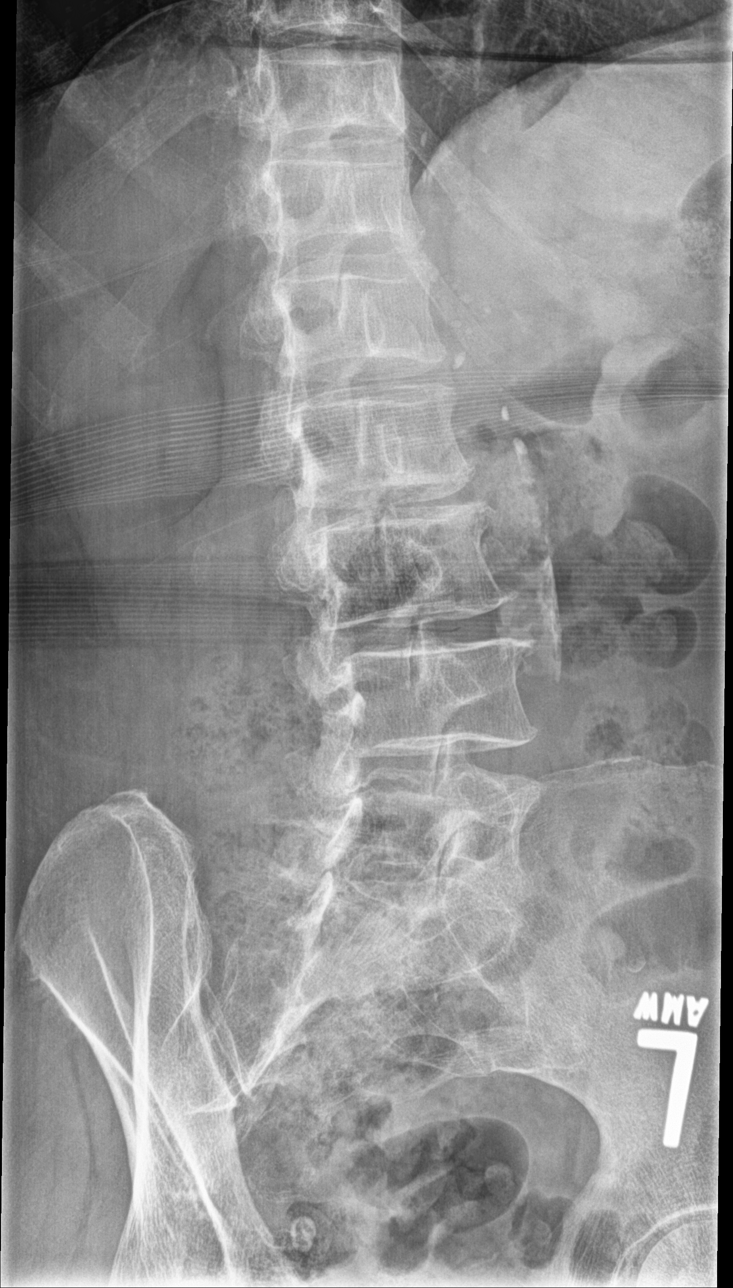

[l-spine lat]
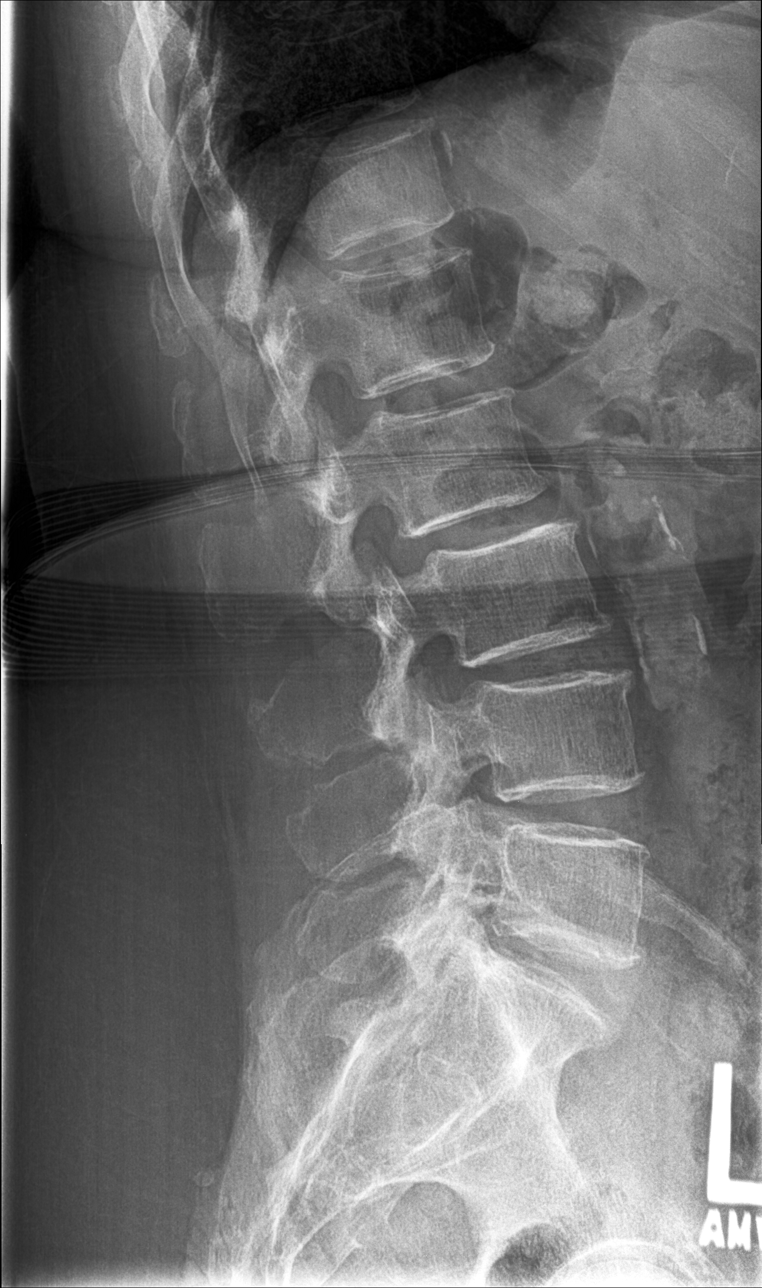

[l-spine spot]
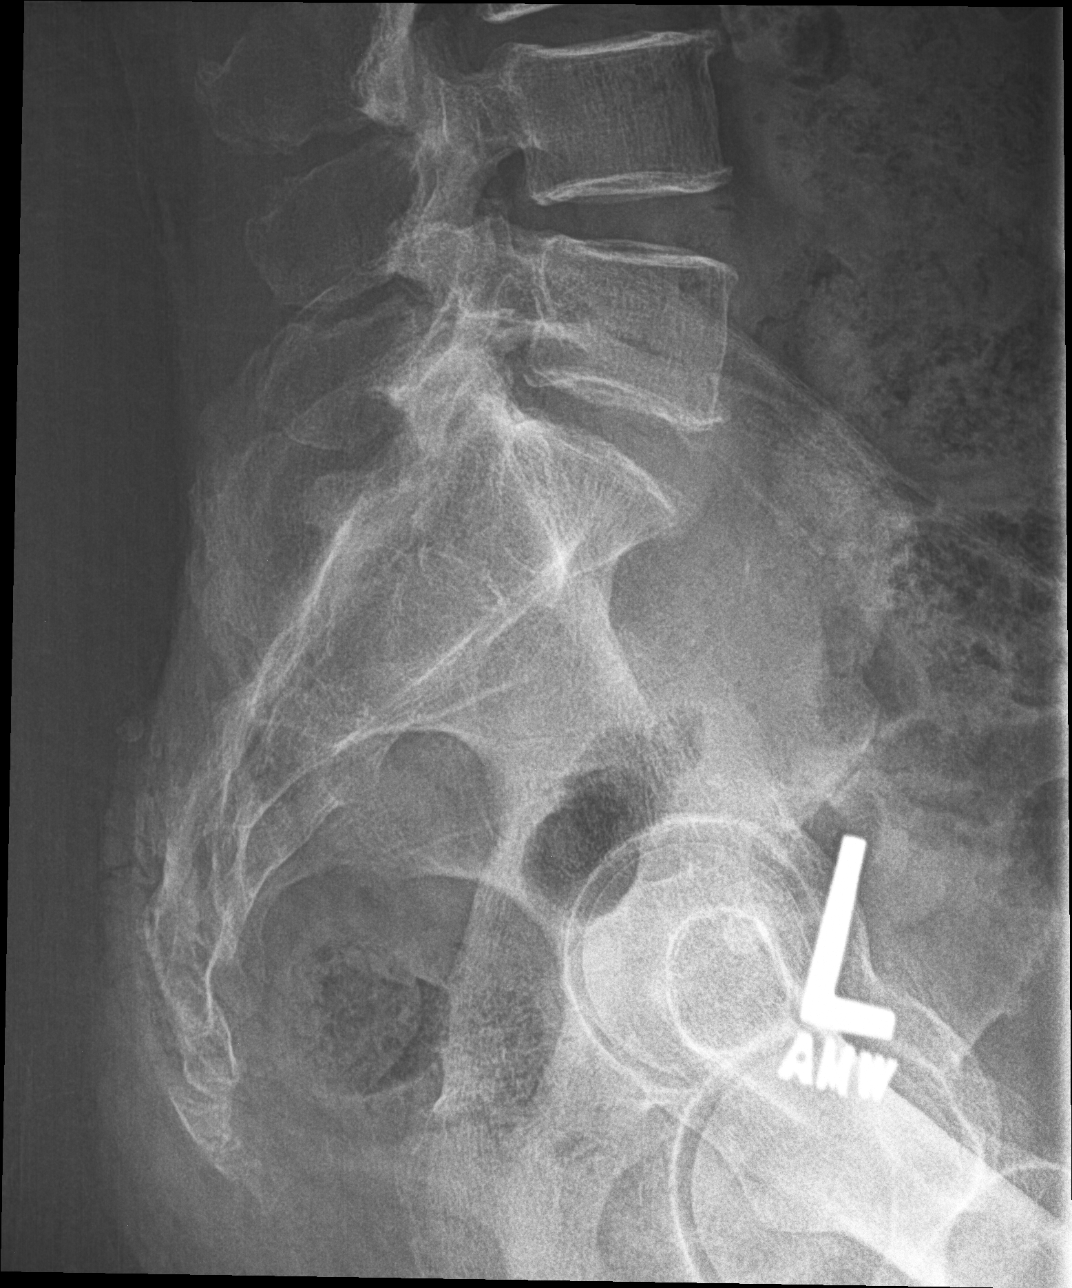

[5 of 5 positions shown; findings below may reference images not displayed]

FINDINGS: Mild degenerative spurring throughout the lumbar spine. Degenerative
facet disease in the lower lumbar spine. Normal alignment. No
fracture. SI joints are symmetric and unremarkable.
IMPRESSION: Mild spondylosis.  No acute findings.

## 2018-01-07 ENCOUNTER — Other Ambulatory Visit: Payer: Self-pay | Admitting: Nurse Practitioner

## 2018-01-07 DIAGNOSIS — Z1231 Encounter for screening mammogram for malignant neoplasm of breast: Secondary | ICD-10-CM

## 2018-01-23 ENCOUNTER — Ambulatory Visit
Admission: RE | Admit: 2018-01-23 | Discharge: 2018-01-23 | Disposition: A | Discharge status: Home / Self Care | Payer: Medicare Other | Source: Ambulatory Visit | Attending: *Deleted | Admitting: *Deleted

## 2018-01-23 DIAGNOSIS — Z1231 Encounter for screening mammogram for malignant neoplasm of breast: Secondary | ICD-10-CM

## 2019-01-07 ENCOUNTER — Other Ambulatory Visit: Payer: Self-pay | Admitting: Nurse Practitioner

## 2019-01-07 DIAGNOSIS — Z1231 Encounter for screening mammogram for malignant neoplasm of breast: Secondary | ICD-10-CM

## 2020-02-10 ENCOUNTER — Ambulatory Visit: Payer: Medicare Other
